# Patient Record
Sex: Male | Born: 2000 | Race: White | Hispanic: No | Marital: Single | Smoking: Never smoker
Health system: Southern US, Community
[De-identification: ages and names within clinical notes are randomized; demographics above are authoritative.]

---
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
## 2022-08-16 IMAGING — MR MR [PERSON_NAME]*[PERSON_NAME]* W/CM
7 series · 40 of 40 positions shown · IV contrast (agent unspecified)
Comparison: None.

CLINICAL DATA: Thumb pain

EXAM:
MRI OF THE RIGHT FINGERS WITH CONTRAST (MR Arthrogram)
TECHNIQUE: Multiplanar, multisequence MR imaging of the right fingers (thumb)
was performed following intra-articular contrast injection under
fluoroscopic guidance. No intravenous contrast was administered.

[Series 5: t1_tse_sag_fs · coronal · 2.0mm · 0.25mm/px · 5 of 14 slices shown]
[im 1/14]
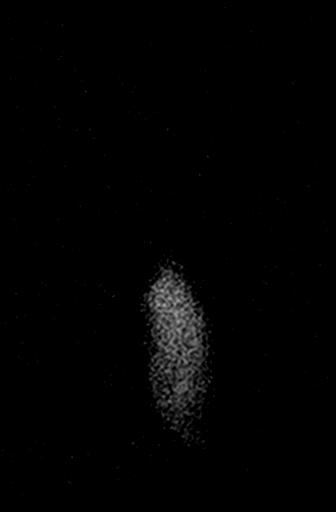
[im 4/14]
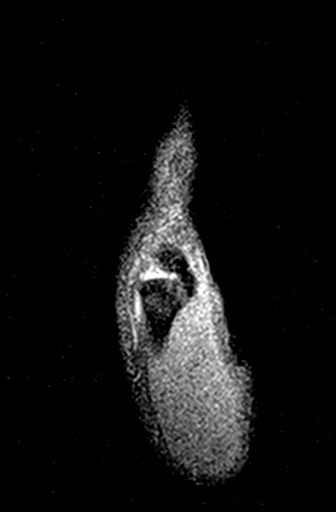
[im 7/14]
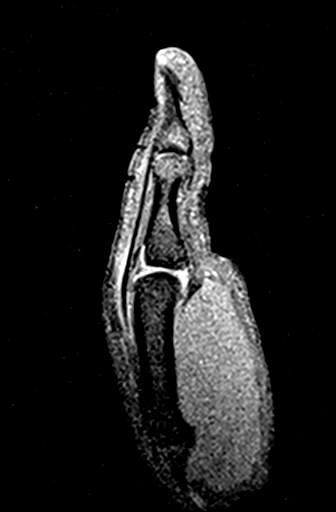
[im 10/14]
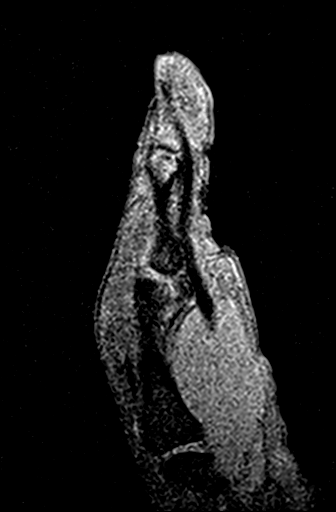
[im 14/14]
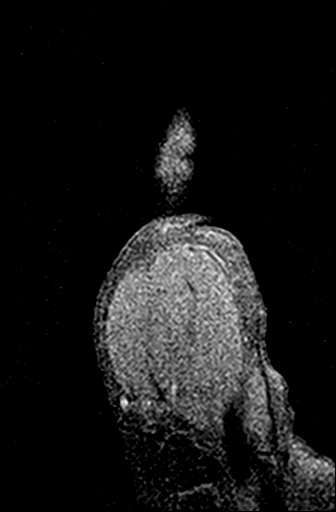

[Series 6: t1_tse_ax fs · axial · 3.0mm · 0.39mm/px · z∈[-85,+18]mm · 9 of 34 slices shown]
[im 1/34]
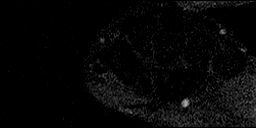
[im 5/34]
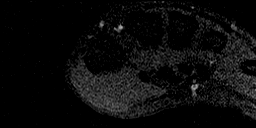
[im 9/34]
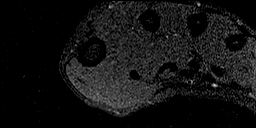
[im 13/34]
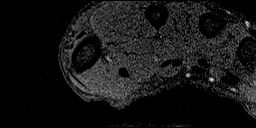
[im 17/34]
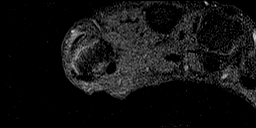
[im 21/34]
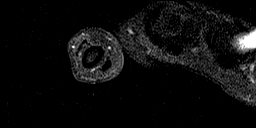
[im 25/34]
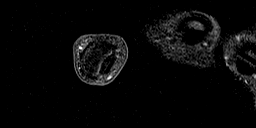
[im 29/34]
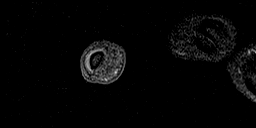
[im 34/34]
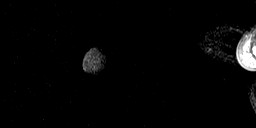

[Series 7: t1_tse_cor_no fs · sagittal · 2.0mm · 0.41mm/px · 5 of 19 slices shown]
[im 1/19]
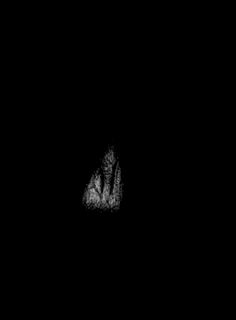
[im 5/19]
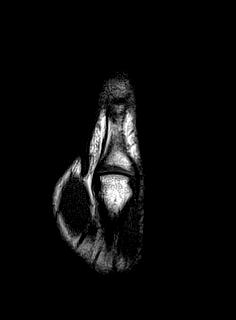
[im 10/19]
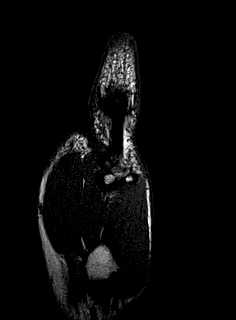
[im 14/19]
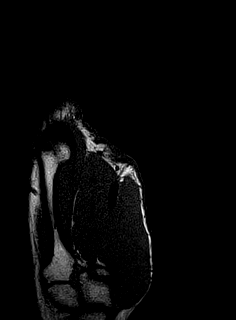
[im 19/19]
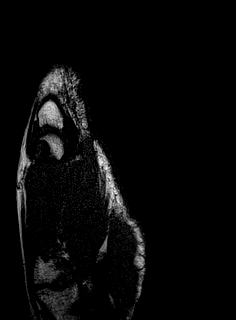

[Series 8: t1_tse_cor_fs repeat · sagittal · 2.0mm · 0.25mm/px · 4 of 16 slices shown]
[im 1/16]
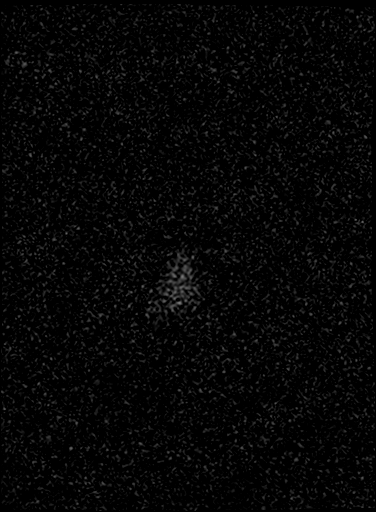
[im 6/16]
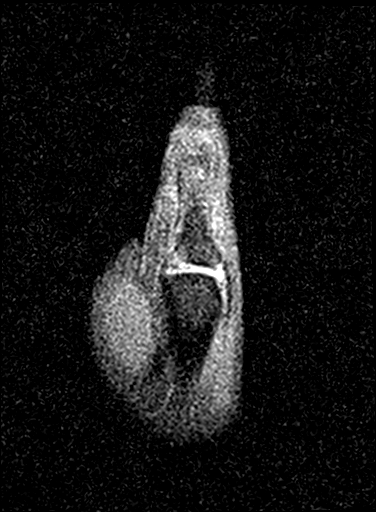
[im 11/16]
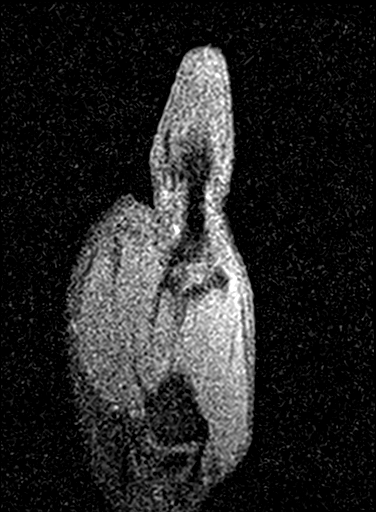
[im 16/16]
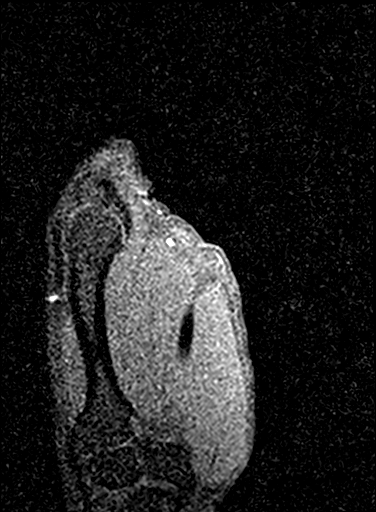

[Series 9: T2 fat-sat · sagittal · 2.0mm · 0.51mm/px · 4 of 15 slices shown]
[im 1/15]
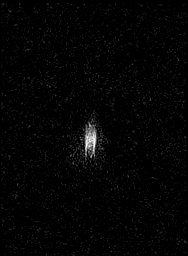
[im 5/15]
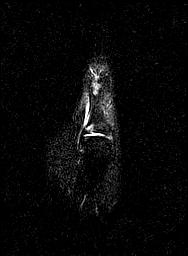
[im 10/15]
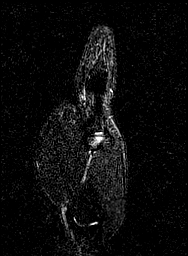
[im 15/15]
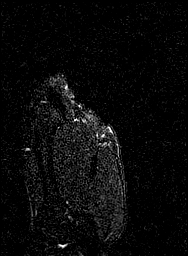

[Series 10: PD fat-sat · coronal · 2.0mm · 0.25mm/px · 4 of 14 slices shown]
[im 1/14]
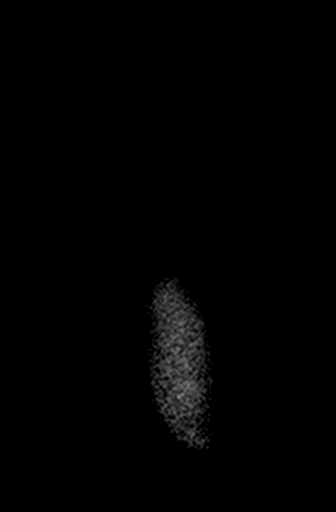
[im 5/14]
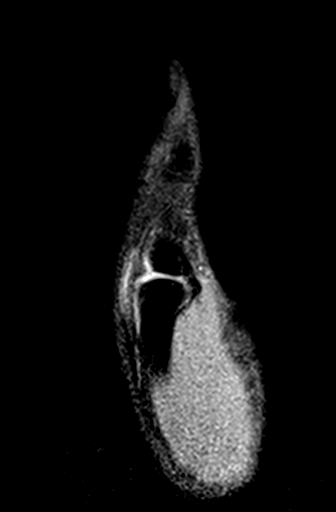
[im 9/14]
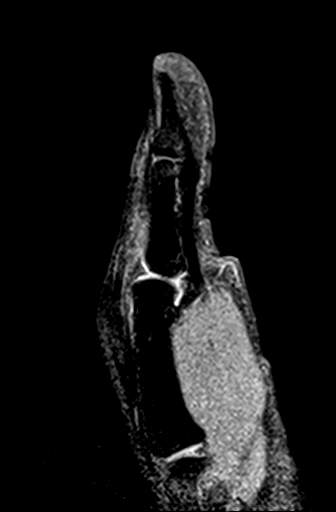
[im 14/14]
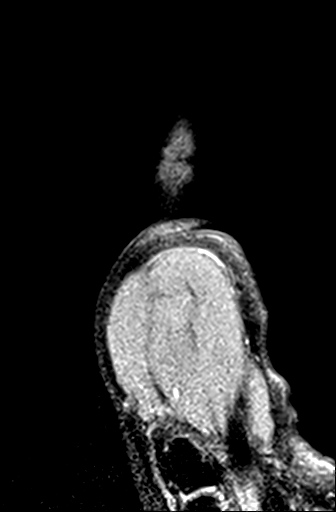

[Series 11: T2 · axial · 3.0mm · 0.20mm/px · z∈[-85,+18]mm · 9 of 34 slices shown]
[im 1/34]
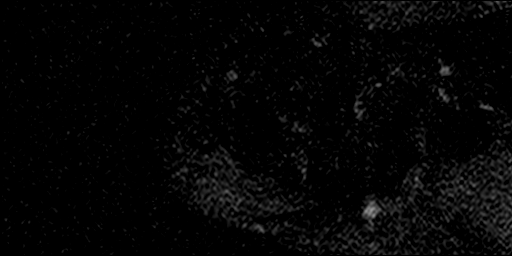
[im 5/34]
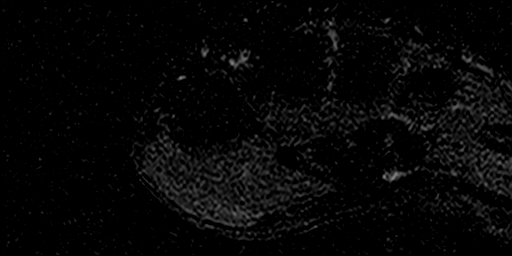
[im 9/34]
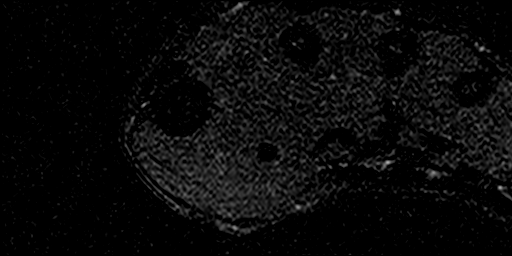
[im 13/34]
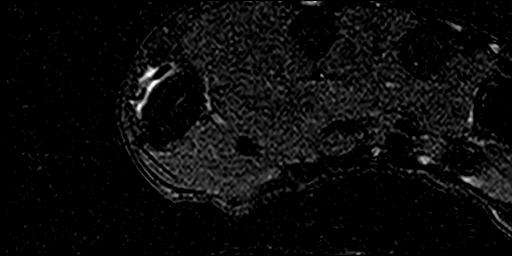
[im 17/34]
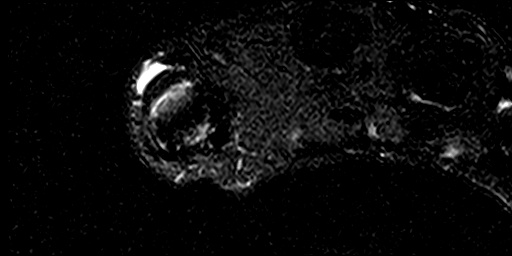
[im 21/34]
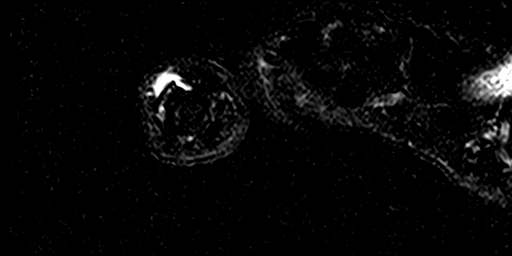
[im 25/34]
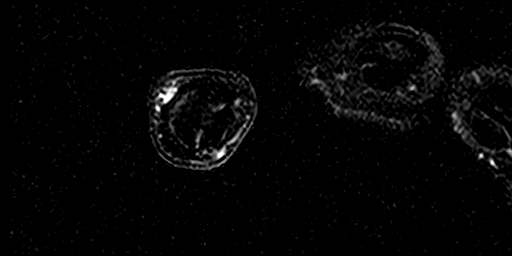
[im 29/34]
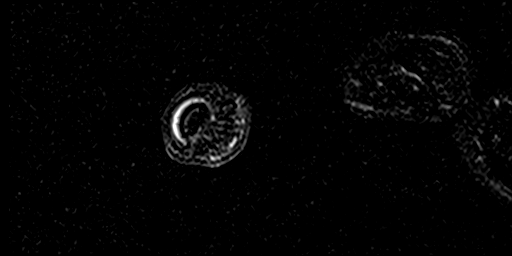
[im 34/34]
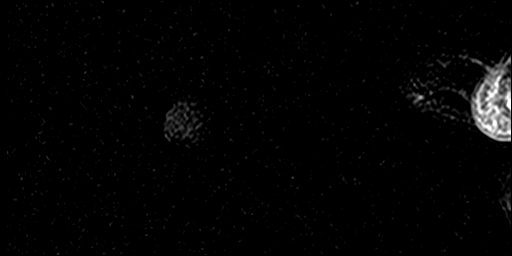

[40 of 40 positions shown; findings below may reference images not displayed]

FINDINGS: There is minimal contrast within the thumb MCP joint. The majority
of contrast tracks along the extensor tendon.

Bones/Joint/Cartilage

There is no evidence of acute fracture. There is artifactually
increased signal at the thumb interphalangeal joint seen on coronal
T2, however the T1 images are completely normal.

Ligaments

The radial and ulnar collateral ligaments are intact. There is mild
increased signal between the dorsal plate and the bone, likely
related to the tiny volume injection into the joint. The dorsal
plate is likely intact.

Muscles and Tendons
No muscle atrophy or edema. There is fluid signal along the extensor
tendon from injection technique. There is no acute tendon tear.

Soft tissue
No soft tissue fluid collection. No cystic or solid mass. No edema.
IMPRESSION: No evidence of collateral ligament tear at the thumb MCP joint.
